# Patient Record
Sex: Male | Born: 1965 | Race: White | Hispanic: No | Marital: Married | Smoking: Current every day smoker
Health system: Southern US, Community
[De-identification: ages and names within clinical notes are randomized; demographics above are authoritative.]

## PROBLEM LIST (undated history)

## (undated) DIAGNOSIS — B1920 Unspecified viral hepatitis C without hepatic coma: Secondary | ICD-10-CM

## (undated) DIAGNOSIS — G35D Multiple sclerosis, unspecified: Secondary | ICD-10-CM

## (undated) DIAGNOSIS — F419 Anxiety disorder, unspecified: Secondary | ICD-10-CM

## (undated) DIAGNOSIS — I219 Acute myocardial infarction, unspecified: Secondary | ICD-10-CM

## (undated) DIAGNOSIS — G35 Multiple sclerosis: Secondary | ICD-10-CM

---

## 2008-05-04 ENCOUNTER — Emergency Department: Payer: Self-pay | Admitting: Emergency Medicine

## 2011-12-09 ENCOUNTER — Encounter (HOSPITAL_BASED_OUTPATIENT_CLINIC_OR_DEPARTMENT_OTHER): Payer: Self-pay | Admitting: Emergency Medicine

## 2011-12-09 ENCOUNTER — Emergency Department (HOSPITAL_BASED_OUTPATIENT_CLINIC_OR_DEPARTMENT_OTHER)
Admission: EM | Admit: 2011-12-09 | Discharge: 2011-12-09 | Disposition: A | Discharge status: Home / Self Care | Payer: Medicaid Other | Attending: Emergency Medicine | Admitting: Emergency Medicine

## 2011-12-09 ENCOUNTER — Emergency Department (HOSPITAL_BASED_OUTPATIENT_CLINIC_OR_DEPARTMENT_OTHER): Payer: Medicaid Other

## 2011-12-09 DIAGNOSIS — S8990XA Unspecified injury of unspecified lower leg, initial encounter: Secondary | ICD-10-CM | POA: Insufficient documentation

## 2011-12-09 DIAGNOSIS — B1920 Unspecified viral hepatitis C without hepatic coma: Secondary | ICD-10-CM | POA: Insufficient documentation

## 2011-12-09 DIAGNOSIS — G35 Multiple sclerosis: Secondary | ICD-10-CM | POA: Insufficient documentation

## 2011-12-09 DIAGNOSIS — W108XXA Fall (on) (from) other stairs and steps, initial encounter: Secondary | ICD-10-CM | POA: Insufficient documentation

## 2011-12-09 DIAGNOSIS — F411 Generalized anxiety disorder: Secondary | ICD-10-CM | POA: Insufficient documentation

## 2011-12-09 DIAGNOSIS — M79671 Pain in right foot: Secondary | ICD-10-CM

## 2011-12-09 DIAGNOSIS — Y92009 Unspecified place in unspecified non-institutional (private) residence as the place of occurrence of the external cause: Secondary | ICD-10-CM | POA: Insufficient documentation

## 2011-12-09 HISTORY — DX: Unspecified viral hepatitis C without hepatic coma: B19.20

## 2011-12-09 HISTORY — DX: Multiple sclerosis: G35

## 2011-12-09 HISTORY — DX: Multiple sclerosis, unspecified: G35.D

## 2011-12-09 HISTORY — DX: Anxiety disorder, unspecified: F41.9

## 2011-12-09 MED ORDER — HYDROCODONE-ACETAMINOPHEN 5-325 MG PO TABS: 2.0000 | ORAL_TABLET | ORAL | Status: DC | PRN

## 2011-12-09 MED ORDER — HYDROCODONE-IBUPROFEN 7.5-200 MG PO TABS: 1.0000 | ORAL_TABLET | Freq: Four times a day (QID) | ORAL | Status: AC | PRN

## 2011-12-09 MED ORDER — HYDROCODONE-ACETAMINOPHEN 5-325 MG PO TABS
2.0000 | ORAL_TABLET | Freq: Once | ORAL | Status: AC
  Administered 2011-12-09: 2 via ORAL
  Filled 2011-12-09: qty 2

## 2011-12-09 NOTE — ED Provider Notes (Signed)
History     CSN: [id number]  Arrival date & time 12/09/11  2045   First MD Initiated Contact with Patient 12/09/11 2048      Chief Complaint  Patient presents with  . Foot Pain    (Consider location/radiation/quality/duration/timing/severity/associated sxs/prior treatment) Patient is a 46 y.o. male presenting with foot injury. The history is provided by the patient. No language interpreter was used.  Foot Injury  The incident occurred 3 to 5 hours ago. The incident occurred at home. The injury mechanism was a fall. The pain is present in the right foot. The quality of the pain is described as aching. The pain is severe. The pain has been constant since onset. Pertinent negatives include no numbness. The symptoms are aggravated by bearing weight. He has tried acetaminophen for the symptoms.  Pt reports he has MS.  Pt reports he fell off of steps.  Pt reports he was able to stand and walk after fall but foot pain has increased. Pt took a vicodin without relief at 5p  Past Medical History  Diagnosis Date  . Multiple sclerosis   . Hepatitis C   . Anxiety     History reviewed. No pertinent past surgical history.  History reviewed. No pertinent family history.  History  Substance Use Topics  . Smoking status: Not on file  . Smokeless tobacco: Not on file  . Alcohol Use: No      Review of Systems  Musculoskeletal: Positive for myalgias.  Neurological: Negative for numbness.  All other systems reviewed and are negative.    Allergies  Review of patient's allergies indicates no known allergies.  Home Medications  No current outpatient prescriptions on file.  BP 126/84  Pulse 88  Resp 18  SpO2 99%  Physical Exam  Nursing note and vitals reviewed. Constitutional: He is oriented to person, place, and time. He appears well-developed and well-nourished.  HENT:  Head: Normocephalic and atraumatic.  Musculoskeletal: He exhibits tenderness.       Tender right foot,   Decreased range of motion,  nv and ns intact  Neurological: He is alert and oriented to person, place, and time. He has normal reflexes.  Skin: Skin is warm and dry.  Psychiatric: He has a normal mood and affect.    ED Course  Procedures (including critical care time)  Labs Reviewed - No data to display No results found.   No diagnosis found.    MDM  vicodin 2 here,          Elson Areas, [place] 12/09/11 2111

## 2011-12-09 NOTE — Discharge Instructions (Signed)
Foot Sprain  The muscles and cord like structures which attach muscle to bone (tendons) that surround the feet are made up of units. A foot sprain can occur at the weakest spot in any of these units. This condition is most often caused by injury to or overuse of the foot, as from playing contact sports, or aggravating a previous injury, or from poor conditioning, or obesity.  SYMPTOMS  · Pain with movement of the foot.  · Tenderness and swelling at the injury site.  · Loss of strength is present in moderate or severe sprains.  THE THREE GRADES OR SEVERITY OF FOOT SPRAIN ARE:  · Mild (Grade I): Slightly pulled muscle without tearing of muscle or tendon fibers or loss of strength.  · Moderate (Grade II): Tearing of fibers in a muscle, tendon, or at the attachment to bone, with small decrease in strength.  · Severe (Grade III): Rupture of the muscle-tendon-bone attachment, with separation of fibers. Severe sprain requires surgical repair. Often repeating (chronic) sprains are caused by overuse. Sudden (acute) sprains are caused by direct injury or over-use.  DIAGNOSIS   Diagnosis of this condition is usually by your own observation. If problems continue, a caregiver may be required for further evaluation and treatment. X-rays may be required to make sure there are not breaks in the bones (fractures) present. Continued problems may require physical therapy for treatment.  PREVENTION  · Use strength and conditioning exercises appropriate for your sport.  · Warm up properly prior to working out.  · Use athletic shoes that are made for the sport you are participating in.  · Allow adequate time for healing. Early return to activities makes repeat injury more likely, and can lead to an unstable arthritic foot that can result in prolonged disability. Mild sprains generally heal in 3 to 10 days, with moderate and severe sprains taking 2 to 10 weeks. Your caregiver can help you determine the proper time required for  healing.  HOME CARE INSTRUCTIONS   · Apply ice to the injury for 15 to 20 minutes, 3 to 4 times per day. Put the ice in a plastic bag and place a towel between the bag of ice and your skin.  · An elastic wrap (like an Ace bandage) may be used to keep swelling down.  · Keep foot above the level of the heart, or at least raised on a footstool, when swelling and pain are present.  · Try to avoid use other than gentle range of motion while the foot is painful. Do not resume use until instructed by your caregiver. Then begin use gradually, not increasing use to the point of pain. If pain does develop, decrease use and continue the above measures, gradually increasing activities that do not cause discomfort, until you gradually achieve normal use.  · Use crutches if and as instructed, and for the length of time instructed.  · Keep injured foot and ankle wrapped between treatments.  · Massage foot and ankle for comfort and to keep swelling down. Massage from the toes up towards the knee.  · Only take over-the-counter or prescription medicines for pain, discomfort, or fever as directed by your caregiver.  SEEK IMMEDIATE MEDICAL CARE IF:   · Your pain and swelling increase, or pain is not controlled with medications.  · You have loss of feeling in your foot or your foot turns cold or blue.  · You develop new, unexplained symptoms, or an increase of the symptoms that brought you   to your caregiver.  MAKE SURE YOU:   · Understand these instructions.  · Will watch your condition.  · Will get help right away if you are not doing well or get worse.  Document Released: 12/17/2001 Document Revised: 06/16/2011 Document Reviewed: 02/14/2008  [employer]® Patient Information ©2012 [employer].

## 2011-12-09 NOTE — ED Notes (Signed)
Pt  Reports walking up stairs at home and developed cramp in right leg, states right side went numb, pt reports h/o MS. Pt states he tried to move but leg did not move, pt fell off porch down to ground (45ft) drop. Did not have immediate difficulties slowly pain increased to point of not being able to walk

## 2011-12-10 NOTE — ED Provider Notes (Signed)
Medical screening examination/treatment/procedure(s) were performed by non-physician practitioner and as supervising physician I was immediately available for consultation/collaboration.  Geoffery Lyons, MD 12/10/11 1331

## 2019-05-24 ENCOUNTER — Other Ambulatory Visit: Payer: Self-pay

## 2019-05-24 ENCOUNTER — Emergency Department (HOSPITAL_COMMUNITY)
Admission: EM | Admit: 2019-05-24 | Discharge: 2019-05-24 | Disposition: A | Discharge status: Home / Self Care | Payer: Medicaid Other | Attending: Emergency Medicine | Admitting: Emergency Medicine

## 2019-05-24 ENCOUNTER — Encounter (HOSPITAL_COMMUNITY): Payer: Self-pay

## 2019-05-24 DIAGNOSIS — Z5321 Procedure and treatment not carried out due to patient leaving prior to being seen by health care provider: Secondary | ICD-10-CM | POA: Diagnosis not present

## 2019-05-24 DIAGNOSIS — R519 Headache, unspecified: Secondary | ICD-10-CM | POA: Insufficient documentation

## 2019-05-24 DIAGNOSIS — M542 Cervicalgia: Secondary | ICD-10-CM | POA: Diagnosis present

## 2019-05-24 HISTORY — DX: Acute myocardial infarction, unspecified: I21.9

## 2019-05-24 NOTE — Progress Notes (Signed)
   05/24/19 1400  Clinical Encounter Type  Visited With Patient;Family (Spouse)  Visit Type Initial;Spiritual support;Psychological support  Referral From Nurse  Consult/Referral To Chaplain  Spiritual Encounters  Spiritual Needs Emotional;Other (Comment) (Spiritual Care Conversation/Support)  Stress Factors  Patient Stress Factors Financial concerns;Health changes;Major life changes   I have been working with Gregory Miranda, Gregory Miranda, husband, who is a patient in our hospital. I escorted him to the emergency department due to ongoing concerns about his headaches. Gregory Miranda told me that he has recently blacked out, hit his nose and woke up in blood from his nose. Gregory Miranda is going through financial difficulties and is currently having to leave the hotel that he and his husband were staying in. I have been providing spiritual support for he and his husband.  Gregory Miranda and I did discuss him being able to utilize the [facility], but this has been put on hold due to him being seen at our emergency department.   Please, contact Spiritual Care for further assistance.   Chaplain Shanon Ace M.Div., [facility]

## 2019-05-24 NOTE — ED Triage Notes (Signed)
Patient reports that he has been having posterior neck pain and headaches x 1 years and worse in the past week. Patient states he has to take his hands and pick his head off of the bed when he tries to get out of bed. Patient states his headache is throbbing. Patient states he has been taking Aleve.  Patient states his significant other is extremely ill in ICU and has extra stressors.

## 2019-06-06 ENCOUNTER — Encounter (HOSPITAL_COMMUNITY): Payer: Self-pay | Admitting: Student

## 2019-06-06 ENCOUNTER — Emergency Department (HOSPITAL_COMMUNITY): Payer: Medicaid Other

## 2019-06-06 ENCOUNTER — Emergency Department (HOSPITAL_COMMUNITY)
Admission: EM | Admit: 2019-06-06 | Discharge: 2019-06-06 | Disposition: A | Discharge status: Home / Self Care | Payer: Medicaid Other | Attending: Emergency Medicine | Admitting: Emergency Medicine

## 2019-06-06 DIAGNOSIS — R9389 Abnormal findings on diagnostic imaging of other specified body structures: Secondary | ICD-10-CM

## 2019-06-06 DIAGNOSIS — G35 Multiple sclerosis: Secondary | ICD-10-CM | POA: Diagnosis not present

## 2019-06-06 DIAGNOSIS — R519 Headache, unspecified: Secondary | ICD-10-CM | POA: Diagnosis not present

## 2019-06-06 DIAGNOSIS — Z79899 Other long term (current) drug therapy: Secondary | ICD-10-CM | POA: Insufficient documentation

## 2019-06-06 DIAGNOSIS — F419 Anxiety disorder, unspecified: Secondary | ICD-10-CM | POA: Diagnosis not present

## 2019-06-06 DIAGNOSIS — R079 Chest pain, unspecified: Secondary | ICD-10-CM | POA: Diagnosis present

## 2019-06-06 DIAGNOSIS — M542 Cervicalgia: Secondary | ICD-10-CM | POA: Diagnosis not present

## 2019-06-06 DIAGNOSIS — D696 Thrombocytopenia, unspecified: Secondary | ICD-10-CM | POA: Diagnosis not present

## 2019-06-06 DIAGNOSIS — F17210 Nicotine dependence, cigarettes, uncomplicated: Secondary | ICD-10-CM | POA: Diagnosis not present

## 2019-06-06 DIAGNOSIS — H538 Other visual disturbances: Secondary | ICD-10-CM | POA: Diagnosis not present

## 2019-06-06 DIAGNOSIS — Z91010 Allergy to peanuts: Secondary | ICD-10-CM | POA: Insufficient documentation

## 2019-06-06 DIAGNOSIS — R0602 Shortness of breath: Secondary | ICD-10-CM | POA: Diagnosis not present

## 2019-06-06 LAB — BASIC METABOLIC PANEL WITH GFR
Anion gap: 9 (ref 5–15)
BUN: 15 mg/dL (ref 6–20)
CO2: 21 mmol/L — ABNORMAL LOW (ref 22–32)
Calcium: 8.8 mg/dL — ABNORMAL LOW (ref 8.9–10.3)
Chloride: 103 mmol/L (ref 98–111)
Creatinine, Ser: 0.92 mg/dL (ref 0.61–1.24)
GFR calc Af Amer: >60 mL/min
GFR calc non Af Amer: >60 mL/min
Glucose, Bld: 91 mg/dL (ref 70–99)
Potassium: 4.2 mmol/L (ref 3.5–5.1)
Sodium: 133 mmol/L — ABNORMAL LOW (ref 135–145)

## 2019-06-06 LAB — CBC
HCT: 43.6 % (ref 39.0–52.0)
Hemoglobin: 14.2 g/dL (ref 13.0–17.0)
MCH: 27 pg (ref 26.0–34.0)
MCHC: 32.6 g/dL (ref 30.0–36.0)
MCV: 83 fL (ref 80.0–100.0)
Platelets: 43 K/uL — ABNORMAL LOW (ref 150–400)
RBC: 5.25 MIL/uL (ref 4.22–5.81)
RDW: 13.4 % (ref 11.5–15.5)
WBC: 5.8 K/uL (ref 4.0–10.5)
nRBC: 0 % (ref 0.0–0.2)

## 2019-06-06 LAB — TROPONIN I (HIGH SENSITIVITY)
Troponin I (High Sensitivity): <2 ng/L
Troponin I (High Sensitivity): <2 ng/L

## 2019-06-06 MED ORDER — DIPHENHYDRAMINE HCL 50 MG/ML IJ SOLN
12.5000 mg | Freq: Once | INTRAMUSCULAR | Status: AC
  Administered 2019-06-06: 20:00:00 12.5 mg via INTRAVENOUS
  Filled 2019-06-06: qty 1

## 2019-06-06 MED ORDER — SODIUM CHLORIDE 0.9 % IV BOLUS
500.0000 mL | Freq: Once | INTRAVENOUS | Status: AC
  Administered 2019-06-06: 20:00:00 500 mL via INTRAVENOUS

## 2019-06-06 MED ORDER — METOCLOPRAMIDE HCL 5 MG/ML IJ SOLN
10.0000 mg | Freq: Once | INTRAMUSCULAR | Status: AC
  Administered 2019-06-06: 10 mg via INTRAVENOUS
  Filled 2019-06-06: qty 2

## 2019-06-06 MED ORDER — SODIUM CHLORIDE 0.9% FLUSH: 3.0000 mL | Freq: Once | INTRAVENOUS | Status: DC

## 2019-06-06 NOTE — ED Notes (Addendum)
Stuck twice by phlebotomy, unable to get labs

## 2019-06-06 NOTE — Discharge Instructions (Addendum)
You were seen in the emergency department today for a headache and chest pain.  Your work-up did not seem consistent with an acute heart attack.  Your chest x-ray showed changes around your right lung which may be something we called artifact which is insignificant but also could be a mass, the radiology team recommended you have a chest CT, this can be scheduled by primary care provider.  Please discuss this within 1 week.  Your labs show that your platelet count was low at 43-this should also be rechecked by primary care provider and discussed within 1 week.  Your head CT was normal.  Please take Tylenol as needed for headache/chest discomfort.   Please follow-up with primary care within 1 week.  Return to the ER for new or worsening symptoms including but not limited to worsening pain, trouble breathing, passing out, change in vision, numbness, weakness, fever, dizziness, or any other concerns.

## 2019-06-06 NOTE — ED Triage Notes (Signed)
Pt bib [facility] after c/o chest pain and HA. Pt arrested by [employer] and began having chest pain and headache. Pt received 324mg  aspirin with EMS and pt denies chest pain on arrival to ED. EMS 12 lead unremarkable. Pt has hx of MS and generalized pain.

## 2019-06-06 NOTE — ED Provider Notes (Signed)
[facility] EMERGENCY DEPARTMENT Provider Note   CSN: [id number] Arrival date & time: 06/06/19  1731     History   Chief Complaint Chief Complaint  Patient presents with  . Chest Pain    HPI Gregory Miranda is a 53 y.o. male with a hx of anxiety, CAD, multiple sclerosis & tobacco abuse who presents to the ED via EMS with complaints of chest discomfort which began shortly PTA and is relatively resolved @ present. Patient states he has had a lot going on recently- his husband was recently diagnosed & hospitalized with stage 4 liver failure- due to this they have been struggling with money and he was evicted from his home. Overall unclear definitive sequence of events but ultimately patient states that he was mistakenly arrested for stolen goods due to having certain keys. He states upon arrest he developed quick onset chest tightness with some dyspnea & anxiety. He was unsure if this could be a heart attack, he has a hx of CAD w/ stent placement that did not feel like this, but was still concerned, he also thought this may have been a panic attack. Denies nausea, vomiting, diaphoresis, or syncope. He also states he has had a headache to the back of his head for the past 4 weeks, headache is constant, gradually worsened, is worse with bright lights, loud noises, & head movement, no alleviating factors. History of migraines but this feels different. No sudden onset. Has associated mild blurry vision intermittently when headache is bad, states it does not feel like an MS flare. No change in numbness/weakness with his MS neuro changes @ baseline. Headache started with increased stress at home. Denies fever, URI symptoms, chills, or dizziness. Denies alcohol or drug use.  Per police at bedside patient was ambulatory without difficulty on scene.     HPI  Past Medical History:  Diagnosis Date  . Anxiety   . Hepatitis C   . MI (myocardial infarction) (HCC)   . Multiple  sclerosis (HCC)     There are no active problems to display for this patient.   No past surgical history on file.      Home Medications    Prior to Admission medications   Medication Sig Start Date End Date Taking? Authorizing Provider  ALPRAZolam Prudy Feeler) 0.5 MG tablet Take 0.5 mg by mouth at bedtime as needed. Patient is using this medication for anxiety.    [provider]  ClonazePAM (KLONOPIN PO) Take 1 tablet by mouth daily.    [provider]  DIAZEPAM PO Take 1 tablet by mouth daily.    [provider]  Esomeprazole Magnesium (NEXIUM PO) Take 1 capsule by mouth daily.    [provider]  GABAPENTIN PO Take 1 tablet by mouth. Patient is using this medication for his MS.    [provider]  Hydrocodone-Acetaminophen (VICODIN PO) Take 1 tablet by mouth daily as needed. Patient uses this medication for pain.    [provider]  Sertraline HCl (ZOLOFT PO) Take 1 tablet by mouth daily as needed. Patient is using this medication for anxiety.    [provider]  traZODone (DESYREL) 100 MG tablet Take 100 mg by mouth at bedtime. Patient is using this medication for anxiety.    [provider]  Zolpidem Tartrate (AMBIEN PO) Take 1 tablet by mouth daily as needed. Patient uses this medication for sleep.    [provider]    Family History Family History  Problem  Relation Age of Onset  . Heart failure Mother   . Heart failure Father     Social History Social History   Tobacco Use  . Smoking status: Current Every Day Smoker    Packs/day: 0.15    Types: Cigarettes  . Smokeless tobacco: Never Used  Substance Use Topics  . Alcohol use: No  . Drug use: No     Allergies   Peanut butter flavor   Review of Systems Review of Systems  Constitutional: Negative for chills, diaphoresis and fever.  HENT: Negative for congestion, ear pain and sore throat.   Eyes: Positive for visual disturbance.   Respiratory: Positive for shortness of breath.   Cardiovascular: Positive for chest pain. Negative for leg swelling.  Gastrointestinal: Negative for abdominal pain, nausea and vomiting.  Neurological: Positive for headaches. Negative for dizziness and speech difficulty.       Negative for acute numbness/weakness in setting of MS.  All other systems reviewed and are negative.    Physical Exam Updated Vital Signs BP 126/81   Pulse 68   Temp 98.2 F (36.8 C) (Oral)   Resp (!) 24   SpO2 96%   Physical Exam Vitals signs and nursing note reviewed.  Constitutional:      General: He is not in acute distress.    Appearance: He is well-developed. He is not toxic-appearing.  HENT:     Head: Normocephalic and atraumatic.  Eyes:     General:        Right eye: No discharge.        Left eye: No discharge.     Extraocular Movements: Extraocular movements intact.     Conjunctiva/sclera: Conjunctivae normal.     Pupils: Pupils are equal, round, and reactive to light.     Comments: No proptosis.   Neck:     Musculoskeletal: Neck supple. Muscular tenderness (Bilateral) present. No neck rigidity.  Cardiovascular:     Rate and Rhythm: Normal rate and regular rhythm.     Pulses:          Radial pulses are 2+ on the right side and 2+ on the left side.  Pulmonary:     Effort: Pulmonary effort is normal. No respiratory distress.     Breath sounds: Normal breath sounds. No wheezing, rhonchi or rales.  Abdominal:     General: There is no distension.     Palpations: Abdomen is soft.     Tenderness: There is no abdominal tenderness.  Musculoskeletal:     Right lower leg: No edema.     Left lower leg: No edema.  Skin:    General: Skin is warm and dry.  Neurological:     Mental Status: He is alert.     Comments: Clear speech.  No focal weakness.  Sensation grossly intact x4.  Cranial nerves grossly intact.  No drift noted.   Psychiatric:        Behavior: Behavior normal.    ED Treatments /  Results  Labs (all labs ordered are listed, but only abnormal results are displayed) Labs Reviewed  BASIC METABOLIC PANEL - Abnormal; Notable for the following components:      Result Value   Sodium 133 (*)    CO2 21 (*)    Calcium 8.8 (*)    All other components within normal limits  CBC - Abnormal; Notable for the following components:   Platelets 43 (*)    All other components within normal limits  TROPONIN I (HIGH  SENSITIVITY)  TROPONIN I (HIGH SENSITIVITY)    EKG EKG Interpretation  Date/Time:  Thursday June 06 2019 17:36:33 EST Ventricular Rate:  88 PR Interval:  140 QRS Duration: 74 QT Interval:  372 QTC Calculation: 450 R Axis:   66 Text Interpretation: Sinus rhythm with Fusion complexes Otherwise normal ECG artifact in inferior leads. need repeat. Confirmed by Charlesetta Shanks [phone]) on 06/06/2019 6:59:05 PM     Radiology Dg Chest 2 View  Result Date: 06/06/2019 CLINICAL DATA:  Chest pain. Additional history provided: Central chest pain with right-sided jaw pain, right-sided neck pain for 2 days. History of myocardial infarction and cardiac stents. EXAM: CHEST - 2 VIEW COMPARISON:  No pertinent prior studies available for comparison. FINDINGS: Heart size within normal limits. Apparent asymmetric increased density within the right lung apex best appreciated on AP radiograph. The lungs are otherwise clear. No evidence of pleural effusion or pneumothorax. No acute bony abnormality. Surgical clips within the right upper quadrant of the abdomen. IMPRESSION: Apparent asymmetric increased density within the right lung apex. While this may be artifactual, CT chest is recommended for further evaluation and to exclude an apical lung mass. Electronically Signed   By: Kellie Simmering DO   On: 06/06/2019 18:33   Ct Head Wo Contrast  Result Date: 06/06/2019 CLINICAL DATA:  Headaches EXAM: CT HEAD WITHOUT CONTRAST TECHNIQUE: Contiguous axial images were obtained from the base of  the skull through the vertex without intravenous contrast. COMPARISON:  None. FINDINGS: Brain: No evidence of acute infarction, hemorrhage, hydrocephalus, extra-axial collection or mass lesion/mass effect. Vascular: No hyperdense vessel or unexpected calcification. Skull: Normal. Negative for fracture or focal lesion. Sinuses/Orbits: No acute finding. Other: None. IMPRESSION: Normal head CT for age Electronically Signed   By: Inez Catalina M.D.   On: 06/06/2019 20:34    Procedures Procedures (including critical care time)  Medications Ordered in ED Medications  sodium chloride flush (NS) 0.9 % injection 3 mL (has no administration in time range)     Initial Impression / Assessment and Plan / ED Course  I have reviewed the triage vital signs and the nursing notes.  Pertinent labs & imaging results that were available during my care of the patient were reviewed by me and considered in my medical decision making (see chart for details).   Patient presents to the emergency department status post arrest by police department with complaints of chest pain which is fairly resolved at present as well as a headache for the past 4 weeks.  Patient is nontoxic-appearing, resting comfortably, vitals without significant abnormality.  No appreciable focal neurologic deficits on exam.  Heart regular rate and rhythm.  Lungs clear to auscultation bilaterally.  Plan for labs, EKG, chest x-ray, and head CT.  Migraine cocktail ordered.  CBC: No leukocytosis.  Thrombocytopenia present which will need PCP recheck. BMP: Mild electrolyte abnormalities, no significant derangement. Renal function preserved.  Troponin: <2, < 2 EKG: No STEMI, repeat due to artifact pictured above- reviewed with Dr. Johnney Killian, no STEMI. CXR: Apparent asymmetric increased density within the right lung apex. While this may be artifactual, CT chest is recommended for further evaluation and to exclude an apical lung mass CT head: Normal head CT.   Low risk Wells, doubt PE.  EKG without concerning ischemic changes or STEMI, high-sensitivity troponins less than 2, doubt ACS.  No widened mediastinum, symmetric pulses, doubt dissection.  No pneumothorax or fluid overload on chest x-ray.  In regards to his asymmetric density thought to be  artifact versus mass, will have patient have outpatient CT scan per discussion with supervising physician.  In regards to his headache, ongoing for 4 weeks- CT head w/o mass, bleed, or other concerning abnormalities, no focal neurologic deficits.  No fever or nuchal rigidity, doubt meningitis. Some improvement with migraine cocktail. Overall appears stable for discharge. I discussed results, treatment plan, need for follow-up, and return precautions with the patient. Provided opportunity for questions, patient confirmed understanding and is in agreement with plan.   This is a shared visit with supervising physician Dr. Donnald GarrePfeiffer who has independently evaluated patient & provided guidance in evaluation/management/disposition, in agreement.    Final Clinical Impressions(s) / ED Diagnoses   Final diagnoses:  Chest pain, unspecified type  Thrombocytopenia [facility](HCC)  Abnormal chest x-ray    ED Discharge Orders    None       Cherly Andersonetrucelli, Randall Colden R, PA-C 06/06/19 2134    Arby BarrettePfeiffer, Marcy, MD 06/07/19 2350

## 2019-06-06 NOTE — ED Provider Notes (Signed)
Medical screening examination/treatment/procedure(s) were conducted as a shared visit with non-physician practitioner(s) and myself.  I personally evaluated the patient during the encounter.  EKG Interpretation  Date/Time:  Thursday June 06 2019 17:36:33 EST Ventricular Rate:  88 PR Interval:  140 QRS Duration: 74 QT Interval:  372 QTC Calculation: 450 R Axis:   66 Text Interpretation: Sinus rhythm with Fusion complexes Otherwise normal ECG artifact in inferior leads. need repeat. Confirmed by Charlesetta Shanks [phone]) on 06/06/2019 6:59:05 PM Patient has multiple medical comorbid illnesses.  He developed chest discomfort prior to arrival.  He has also been having ongoing problems with headache.  He does have history of migraines.  Symptoms were exacerbated and precipitated when police arrived to put patient under arrest.  Patient is alert.  No respiratory distress.  Heart regular.  Lungs clear.  Extremities are in good condition.  No peripheral edema.  Calf soft and nontender.  He can move and withdraw extremities at will.  I agree with plan of management.   Charlesetta Shanks, MD 06/07/19 [phone]

## 2020-03-16 IMAGING — CT CT HEAD W/O CM
4 series · 17 of 47 positions shown, 19 images · non-contrast
Comparison: None.

CLINICAL DATA: Headaches

EXAM:
CT HEAD WITHOUT CONTRAST
TECHNIQUE: Contiguous axial images were obtained from the base of the skull
through the vertex without intravenous contrast.

[Series 3: head wo · axial · 0.42mm/px · z∈[-202,-82]mm · 7 of 33 slices shown, 9 images]
[im 5/33  brain]
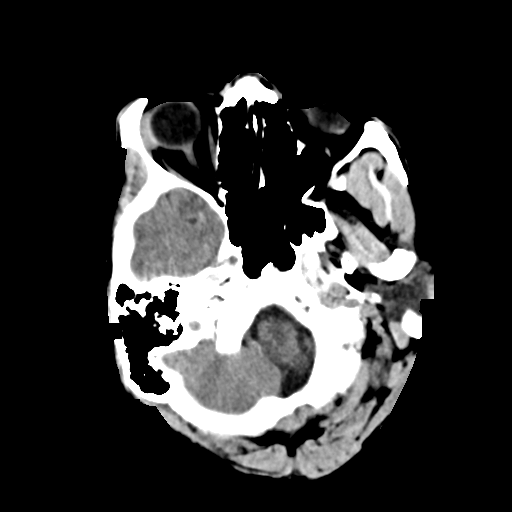
[im 5/33  bone]
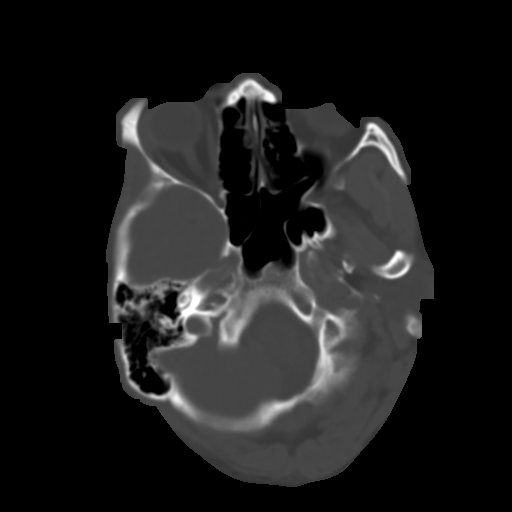
[im 9/33  brain]
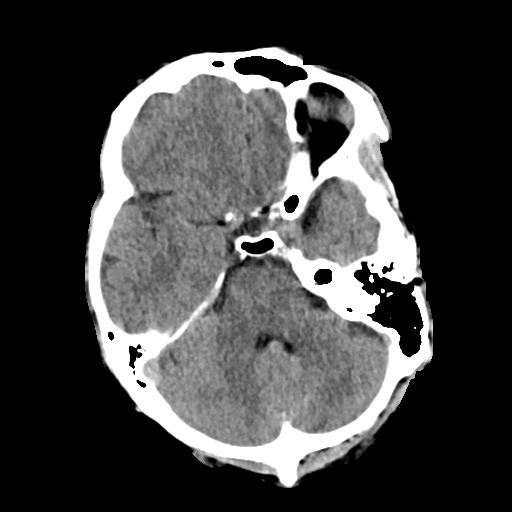
[im 13/33  brain]
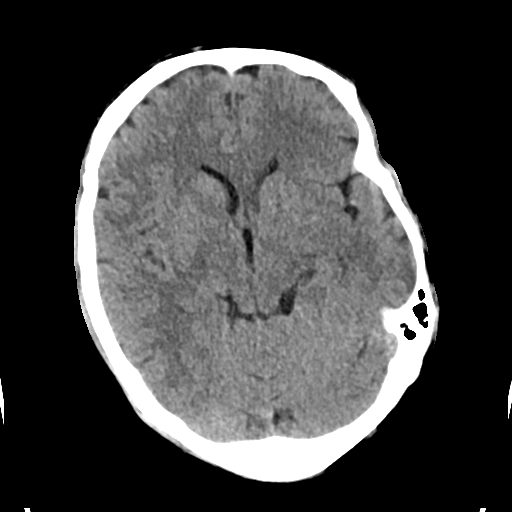
[im 17/33  brain]
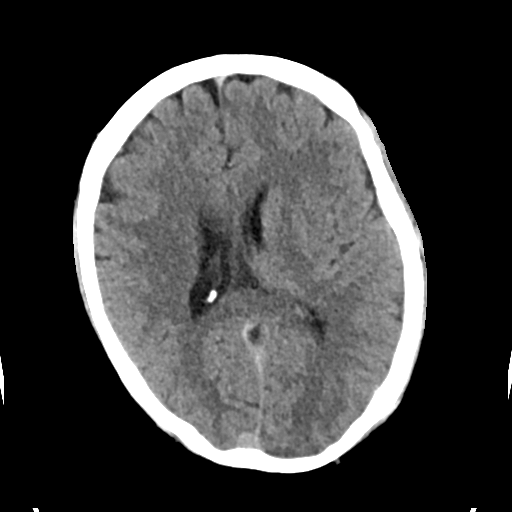
[im 21/33  brain]
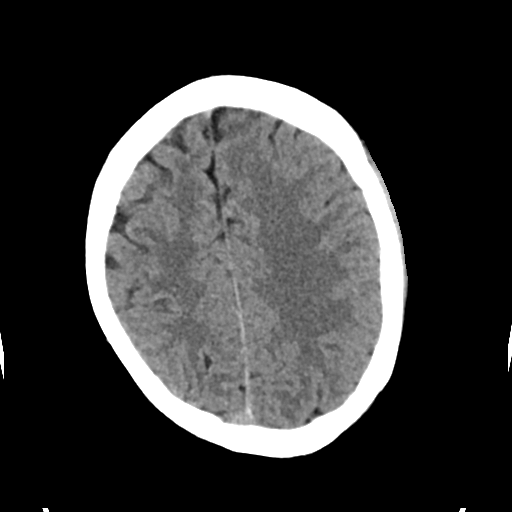
[im 21/33  bone]
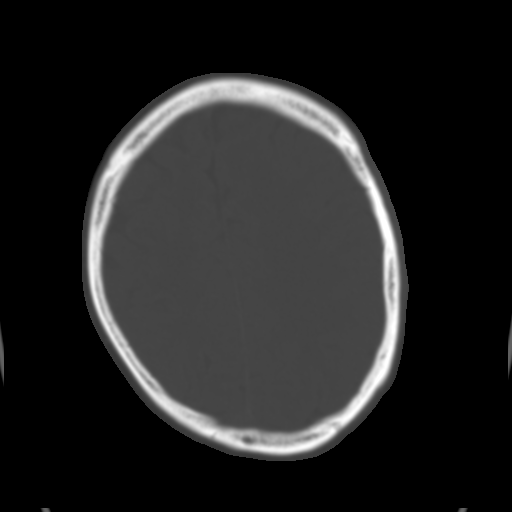
[im 25/33  brain]
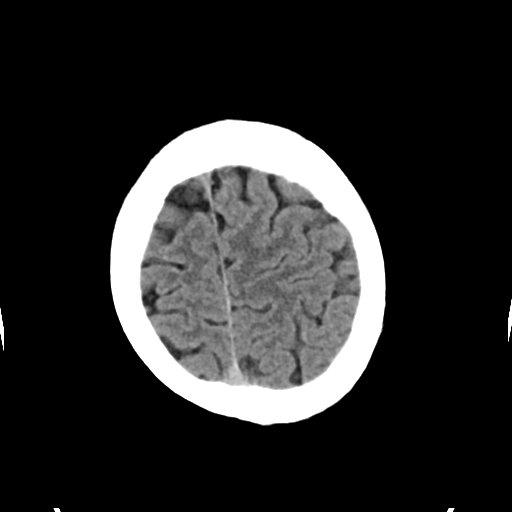
[im 29/33  brain]
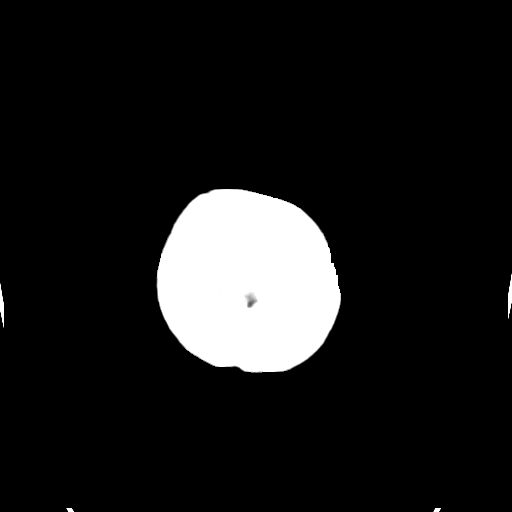

[Series 4: head bone · axial · 0.42mm/px · z∈[-206,-150]mm · 4 of 81 slices shown]
[im 9/81  bone]
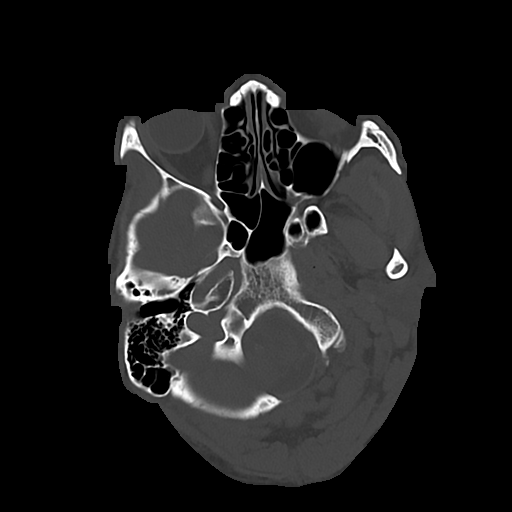
[im 17/81  bone]
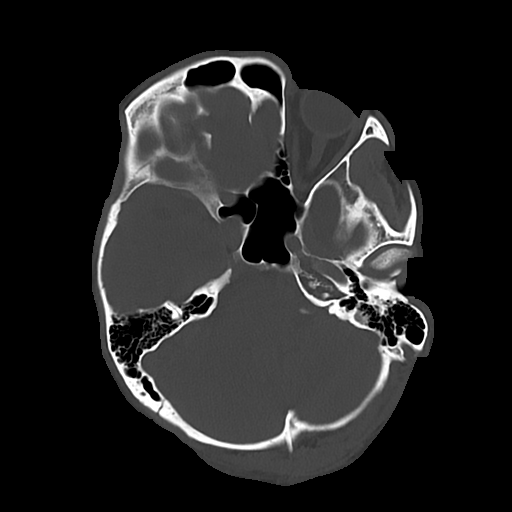
[im 25/81  bone]
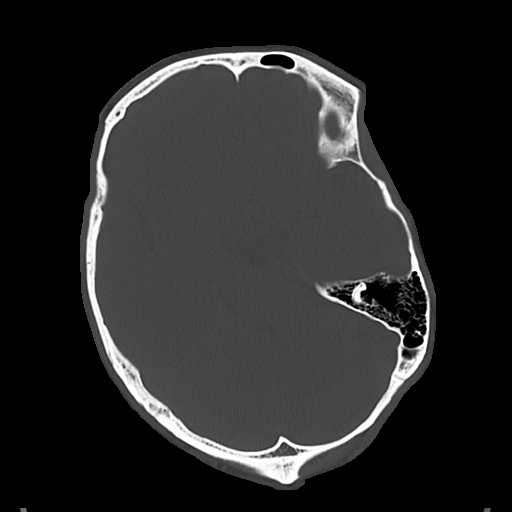
[im 37/81  bone]
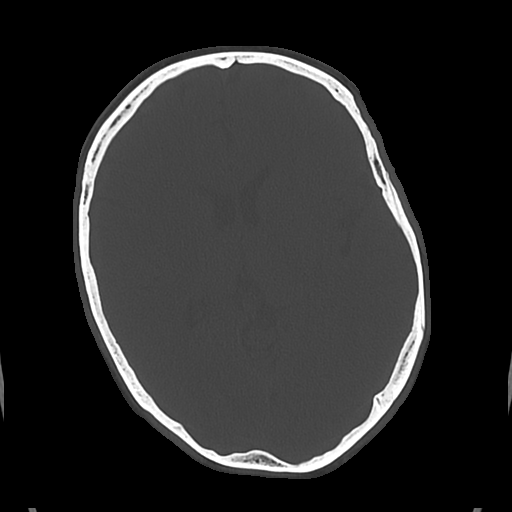

[Series 5: cor soft · coronal · 0.35mm/px · 3 of 64 slices shown]
[im 22/64  brain]
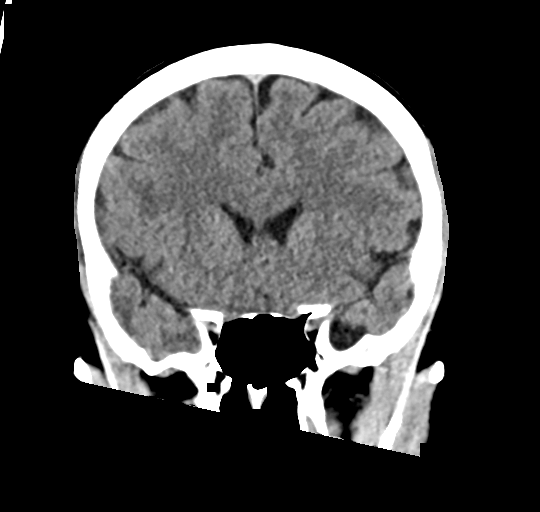
[im 29/64  brain]
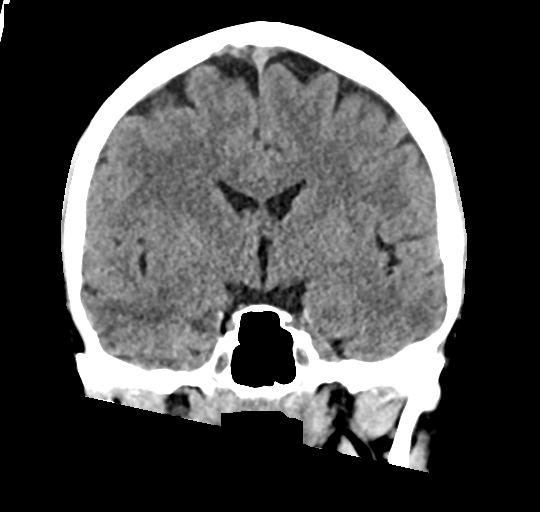
[im 36/64  brain]
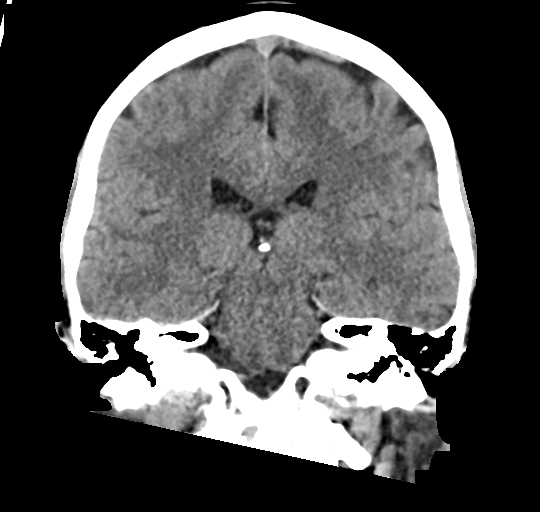

[Series 6: sag soft · sagittal · 0.35mm/px · 3 of 62 slices shown]
[im 25/62  brain]
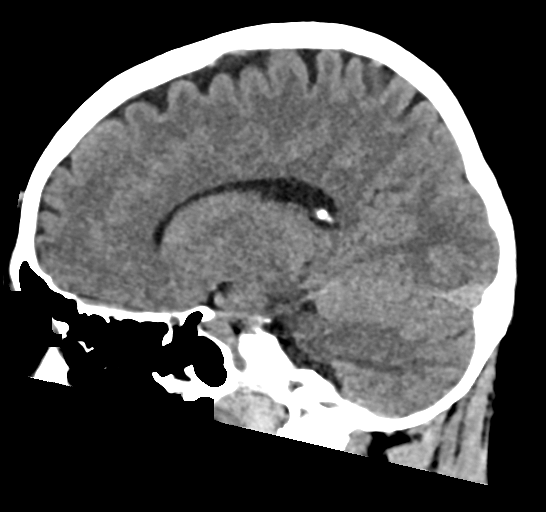
[im 30/62  brain]
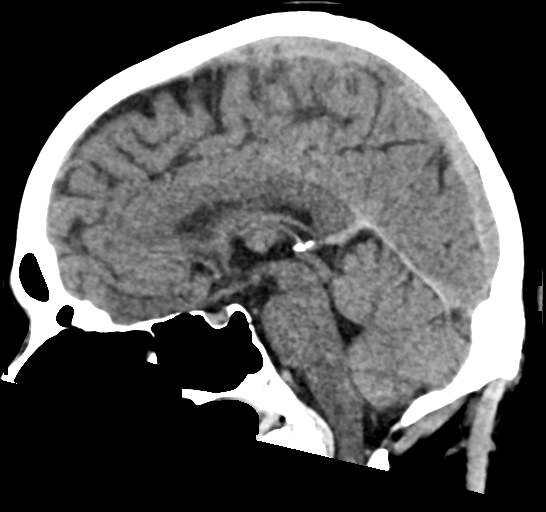
[im 35/62  brain]
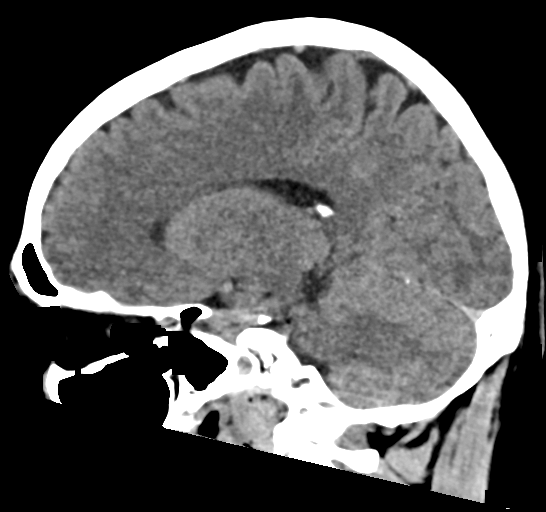

[17 of 47 positions shown; findings below may reference images not displayed]

FINDINGS: Brain: No evidence of acute infarction, hemorrhage, hydrocephalus,
extra-axial collection or mass lesion/mass effect.

Vascular: No hyperdense vessel or unexpected calcification.

Skull: Normal. Negative for fracture or focal lesion.

Sinuses/Orbits: No acute finding.

Other: None.
IMPRESSION: Normal head CT for age
